# Patient Record
Sex: Female | Born: 1945 | Race: White | Hispanic: No | Marital: Married
Health system: Southern US, Community
[De-identification: ages and names within clinical notes are randomized; demographics above are authoritative.]

---

## 2000-03-13 ENCOUNTER — Encounter: Payer: Self-pay | Admitting: *Deleted

## 2000-03-13 ENCOUNTER — Encounter: Admission: RE | Admit: 2000-03-13 | Discharge: 2000-03-13 | Payer: Self-pay | Admitting: *Deleted

## 2000-04-08 ENCOUNTER — Ambulatory Visit (HOSPITAL_COMMUNITY): Admission: RE | Admit: 2000-04-08 | Discharge: 2000-04-08 | Payer: Self-pay | Admitting: *Deleted

## 2000-04-08 ENCOUNTER — Encounter: Payer: Self-pay | Admitting: *Deleted

## 2000-06-10 ENCOUNTER — Observation Stay (HOSPITAL_COMMUNITY): Admission: EM | Admit: 2000-06-10 | Discharge: 2000-06-12 | Payer: Self-pay | Admitting: *Deleted

## 2005-11-01 ENCOUNTER — Other Ambulatory Visit: Admission: RE | Admit: 2005-11-01 | Discharge: 2005-11-01 | Payer: Self-pay | Admitting: *Deleted

## 2012-09-11 DIAGNOSIS — J309 Allergic rhinitis, unspecified: Secondary | ICD-10-CM | POA: Diagnosis not present

## 2013-03-09 DIAGNOSIS — J309 Allergic rhinitis, unspecified: Secondary | ICD-10-CM | POA: Diagnosis not present

## 2014-01-20 DIAGNOSIS — F329 Major depressive disorder, single episode, unspecified: Secondary | ICD-10-CM | POA: Diagnosis not present

## 2014-01-20 DIAGNOSIS — J309 Allergic rhinitis, unspecified: Secondary | ICD-10-CM | POA: Diagnosis not present

## 2014-01-20 DIAGNOSIS — Z1211 Encounter for screening for malignant neoplasm of colon: Secondary | ICD-10-CM | POA: Diagnosis not present

## 2014-01-25 DIAGNOSIS — Z1211 Encounter for screening for malignant neoplasm of colon: Secondary | ICD-10-CM | POA: Diagnosis not present

## 2014-03-10 DIAGNOSIS — J069 Acute upper respiratory infection, unspecified: Secondary | ICD-10-CM | POA: Diagnosis not present

## 2014-03-10 DIAGNOSIS — E663 Overweight: Secondary | ICD-10-CM | POA: Diagnosis not present

## 2014-03-10 DIAGNOSIS — J309 Allergic rhinitis, unspecified: Secondary | ICD-10-CM | POA: Diagnosis not present

## 2014-03-10 DIAGNOSIS — Z780 Asymptomatic menopausal state: Secondary | ICD-10-CM | POA: Diagnosis not present

## 2014-03-10 DIAGNOSIS — Z0001 Encounter for general adult medical examination with abnormal findings: Secondary | ICD-10-CM | POA: Diagnosis not present

## 2014-03-10 DIAGNOSIS — Z23 Encounter for immunization: Secondary | ICD-10-CM | POA: Diagnosis not present

## 2014-03-10 DIAGNOSIS — F329 Major depressive disorder, single episode, unspecified: Secondary | ICD-10-CM | POA: Diagnosis not present

## 2014-03-10 DIAGNOSIS — E782 Mixed hyperlipidemia: Secondary | ICD-10-CM | POA: Diagnosis not present

## 2014-03-22 DIAGNOSIS — R0981 Nasal congestion: Secondary | ICD-10-CM | POA: Diagnosis not present

## 2014-03-22 DIAGNOSIS — R5383 Other fatigue: Secondary | ICD-10-CM | POA: Diagnosis not present

## 2014-03-25 DIAGNOSIS — M818 Other osteoporosis without current pathological fracture: Secondary | ICD-10-CM | POA: Diagnosis not present

## 2014-04-07 DIAGNOSIS — K219 Gastro-esophageal reflux disease without esophagitis: Secondary | ICD-10-CM | POA: Diagnosis not present

## 2014-04-07 DIAGNOSIS — R05 Cough: Secondary | ICD-10-CM | POA: Diagnosis not present

## 2014-04-07 DIAGNOSIS — J309 Allergic rhinitis, unspecified: Secondary | ICD-10-CM | POA: Diagnosis not present

## 2014-04-07 DIAGNOSIS — J300 Vasomotor rhinitis: Secondary | ICD-10-CM | POA: Diagnosis not present

## 2014-04-21 DIAGNOSIS — R7309 Other abnormal glucose: Secondary | ICD-10-CM | POA: Diagnosis not present

## 2014-04-21 DIAGNOSIS — Z0001 Encounter for general adult medical examination with abnormal findings: Secondary | ICD-10-CM | POA: Diagnosis not present

## 2014-04-21 DIAGNOSIS — M810 Age-related osteoporosis without current pathological fracture: Secondary | ICD-10-CM | POA: Diagnosis not present

## 2014-04-21 DIAGNOSIS — E785 Hyperlipidemia, unspecified: Secondary | ICD-10-CM | POA: Diagnosis not present

## 2014-09-29 DIAGNOSIS — M79675 Pain in left toe(s): Secondary | ICD-10-CM | POA: Diagnosis not present

## 2014-09-29 DIAGNOSIS — B351 Tinea unguium: Secondary | ICD-10-CM | POA: Diagnosis not present

## 2014-09-29 DIAGNOSIS — L600 Ingrowing nail: Secondary | ICD-10-CM | POA: Diagnosis not present

## 2014-10-25 DIAGNOSIS — F329 Major depressive disorder, single episode, unspecified: Secondary | ICD-10-CM | POA: Diagnosis not present

## 2014-10-25 DIAGNOSIS — E663 Overweight: Secondary | ICD-10-CM | POA: Diagnosis not present

## 2014-10-25 DIAGNOSIS — M810 Age-related osteoporosis without current pathological fracture: Secondary | ICD-10-CM | POA: Diagnosis not present

## 2014-10-25 DIAGNOSIS — R7309 Other abnormal glucose: Secondary | ICD-10-CM | POA: Diagnosis not present

## 2014-10-25 DIAGNOSIS — E785 Hyperlipidemia, unspecified: Secondary | ICD-10-CM | POA: Diagnosis not present

## 2015-04-20 DIAGNOSIS — Z23 Encounter for immunization: Secondary | ICD-10-CM | POA: Diagnosis not present

## 2015-05-05 DIAGNOSIS — F329 Major depressive disorder, single episode, unspecified: Secondary | ICD-10-CM | POA: Diagnosis not present

## 2015-05-05 DIAGNOSIS — M25649 Stiffness of unspecified hand, not elsewhere classified: Secondary | ICD-10-CM | POA: Diagnosis not present

## 2015-05-05 DIAGNOSIS — M25561 Pain in right knee: Secondary | ICD-10-CM | POA: Diagnosis not present

## 2016-02-09 ENCOUNTER — Other Ambulatory Visit: Payer: Self-pay

## 2016-05-08 DIAGNOSIS — R739 Hyperglycemia, unspecified: Secondary | ICD-10-CM | POA: Diagnosis not present

## 2016-05-08 DIAGNOSIS — K219 Gastro-esophageal reflux disease without esophagitis: Secondary | ICD-10-CM | POA: Diagnosis not present

## 2016-05-08 DIAGNOSIS — F329 Major depressive disorder, single episode, unspecified: Secondary | ICD-10-CM | POA: Diagnosis not present

## 2016-05-08 DIAGNOSIS — Z23 Encounter for immunization: Secondary | ICD-10-CM | POA: Diagnosis not present

## 2016-05-08 DIAGNOSIS — M810 Age-related osteoporosis without current pathological fracture: Secondary | ICD-10-CM | POA: Diagnosis not present

## 2016-05-08 DIAGNOSIS — J309 Allergic rhinitis, unspecified: Secondary | ICD-10-CM | POA: Diagnosis not present

## 2016-05-08 DIAGNOSIS — E7800 Pure hypercholesterolemia, unspecified: Secondary | ICD-10-CM | POA: Diagnosis not present

## 2016-05-08 DIAGNOSIS — Z1211 Encounter for screening for malignant neoplasm of colon: Secondary | ICD-10-CM | POA: Diagnosis not present

## 2016-05-08 DIAGNOSIS — Z0000 Encounter for general adult medical examination without abnormal findings: Secondary | ICD-10-CM | POA: Diagnosis not present

## 2016-05-23 DIAGNOSIS — Z1211 Encounter for screening for malignant neoplasm of colon: Secondary | ICD-10-CM | POA: Diagnosis not present

## 2016-06-15 DIAGNOSIS — J329 Chronic sinusitis, unspecified: Secondary | ICD-10-CM | POA: Diagnosis not present

## 2016-06-26 DIAGNOSIS — M8588 Other specified disorders of bone density and structure, other site: Secondary | ICD-10-CM | POA: Diagnosis not present

## 2016-06-26 DIAGNOSIS — M810 Age-related osteoporosis without current pathological fracture: Secondary | ICD-10-CM | POA: Diagnosis not present

## 2017-04-24 DIAGNOSIS — Z23 Encounter for immunization: Secondary | ICD-10-CM | POA: Diagnosis not present

## 2017-09-17 DIAGNOSIS — E119 Type 2 diabetes mellitus without complications: Secondary | ICD-10-CM | POA: Diagnosis not present

## 2017-09-17 DIAGNOSIS — Z6828 Body mass index (BMI) 28.0-28.9, adult: Secondary | ICD-10-CM | POA: Diagnosis not present

## 2017-09-17 DIAGNOSIS — Z0000 Encounter for general adult medical examination without abnormal findings: Secondary | ICD-10-CM | POA: Diagnosis not present

## 2017-09-17 DIAGNOSIS — M8580 Other specified disorders of bone density and structure, unspecified site: Secondary | ICD-10-CM | POA: Diagnosis not present

## 2017-09-17 DIAGNOSIS — E663 Overweight: Secondary | ICD-10-CM | POA: Diagnosis not present

## 2017-09-17 DIAGNOSIS — Z1389 Encounter for screening for other disorder: Secondary | ICD-10-CM | POA: Diagnosis not present

## 2017-09-17 DIAGNOSIS — F325 Major depressive disorder, single episode, in full remission: Secondary | ICD-10-CM | POA: Diagnosis not present

## 2017-12-09 ENCOUNTER — Other Ambulatory Visit: Payer: Self-pay | Admitting: Internal Medicine

## 2018-01-02 DIAGNOSIS — E782 Mixed hyperlipidemia: Secondary | ICD-10-CM | POA: Diagnosis not present

## 2018-01-20 DIAGNOSIS — H25812 Combined forms of age-related cataract, left eye: Secondary | ICD-10-CM | POA: Diagnosis not present

## 2018-01-20 DIAGNOSIS — Z01818 Encounter for other preprocedural examination: Secondary | ICD-10-CM | POA: Diagnosis not present

## 2018-02-14 DIAGNOSIS — H2512 Age-related nuclear cataract, left eye: Secondary | ICD-10-CM | POA: Diagnosis not present

## 2018-02-14 DIAGNOSIS — H25812 Combined forms of age-related cataract, left eye: Secondary | ICD-10-CM | POA: Diagnosis not present

## 2018-02-28 DIAGNOSIS — H2511 Age-related nuclear cataract, right eye: Secondary | ICD-10-CM | POA: Diagnosis not present

## 2018-02-28 DIAGNOSIS — H25811 Combined forms of age-related cataract, right eye: Secondary | ICD-10-CM | POA: Diagnosis not present

## 2018-03-24 DIAGNOSIS — Z23 Encounter for immunization: Secondary | ICD-10-CM | POA: Diagnosis not present

## 2018-03-24 DIAGNOSIS — M8580 Other specified disorders of bone density and structure, unspecified site: Secondary | ICD-10-CM | POA: Diagnosis not present

## 2018-03-24 DIAGNOSIS — E663 Overweight: Secondary | ICD-10-CM | POA: Diagnosis not present

## 2018-03-24 DIAGNOSIS — Z6828 Body mass index (BMI) 28.0-28.9, adult: Secondary | ICD-10-CM | POA: Diagnosis not present

## 2018-03-24 DIAGNOSIS — E1169 Type 2 diabetes mellitus with other specified complication: Secondary | ICD-10-CM | POA: Diagnosis not present

## 2018-03-24 DIAGNOSIS — F325 Major depressive disorder, single episode, in full remission: Secondary | ICD-10-CM | POA: Diagnosis not present

## 2018-03-24 DIAGNOSIS — E7800 Pure hypercholesterolemia, unspecified: Secondary | ICD-10-CM | POA: Diagnosis not present

## 2018-10-15 DIAGNOSIS — M8580 Other specified disorders of bone density and structure, unspecified site: Secondary | ICD-10-CM | POA: Diagnosis not present

## 2018-10-15 DIAGNOSIS — F325 Major depressive disorder, single episode, in full remission: Secondary | ICD-10-CM | POA: Diagnosis not present

## 2018-10-15 DIAGNOSIS — Z0000 Encounter for general adult medical examination without abnormal findings: Secondary | ICD-10-CM | POA: Diagnosis not present

## 2018-10-15 DIAGNOSIS — E782 Mixed hyperlipidemia: Secondary | ICD-10-CM | POA: Diagnosis not present

## 2018-10-15 DIAGNOSIS — E1169 Type 2 diabetes mellitus with other specified complication: Secondary | ICD-10-CM | POA: Diagnosis not present

## 2019-04-03 DIAGNOSIS — Z23 Encounter for immunization: Secondary | ICD-10-CM | POA: Diagnosis not present

## 2019-04-08 DIAGNOSIS — E782 Mixed hyperlipidemia: Secondary | ICD-10-CM | POA: Diagnosis not present

## 2019-04-08 DIAGNOSIS — M8580 Other specified disorders of bone density and structure, unspecified site: Secondary | ICD-10-CM | POA: Diagnosis not present

## 2019-04-08 DIAGNOSIS — F325 Major depressive disorder, single episode, in full remission: Secondary | ICD-10-CM | POA: Diagnosis not present

## 2019-04-08 DIAGNOSIS — E1169 Type 2 diabetes mellitus with other specified complication: Secondary | ICD-10-CM | POA: Diagnosis not present

## 2019-04-15 DIAGNOSIS — E782 Mixed hyperlipidemia: Secondary | ICD-10-CM | POA: Diagnosis not present

## 2019-04-15 DIAGNOSIS — M859 Disorder of bone density and structure, unspecified: Secondary | ICD-10-CM | POA: Diagnosis not present

## 2019-04-15 DIAGNOSIS — E1169 Type 2 diabetes mellitus with other specified complication: Secondary | ICD-10-CM | POA: Diagnosis not present

## 2019-04-15 DIAGNOSIS — F325 Major depressive disorder, single episode, in full remission: Secondary | ICD-10-CM | POA: Diagnosis not present

## 2019-07-30 ENCOUNTER — Ambulatory Visit: Payer: Medicare Other | Attending: Internal Medicine

## 2019-07-30 ENCOUNTER — Ambulatory Visit: Payer: Self-pay

## 2019-07-30 DIAGNOSIS — Z23 Encounter for immunization: Secondary | ICD-10-CM | POA: Insufficient documentation

## 2019-07-30 NOTE — Progress Notes (Signed)
Covid-19 Vaccination [facility]  Name:  Fatisha Petitte    MRN: [id number] DOB: 1945-11-14  07/30/2019  Ms. Ferdon was observed post Covid-19 immunization for 15 minutes without incidence. She was provided with Vaccine Information Sheet and instruction to access the V-Safe system.   Ms. Sicking was instructed to call 911 with any severe reactions post vaccine: Marland Kitchen Difficulty breathing  . Swelling of your face and throat  . A fast heartbeat  . A bad rash all over your body  . Dizziness and weakness    Immunizations Administered    Name Date Dose VIS Date Route   Pfizer COVID-19 Vaccine 07/30/2019  2:15 PM 0.3 mL 05/15/2019 Intramuscular   Manufacturer: [employer], [employer]   Lot: [id number]   NDC: [address]-7

## 2019-08-25 ENCOUNTER — Ambulatory Visit: Payer: Medicare Other | Attending: Internal Medicine

## 2019-08-25 DIAGNOSIS — Z23 Encounter for immunization: Secondary | ICD-10-CM

## 2019-08-25 NOTE — Progress Notes (Signed)
Covid-19 Vaccination Clinic  Name:  Emily Pruitt    MRN: [id number] DOB: 1946-01-05  08/25/2019  Ms. Calcutt was observed post Covid-19 immunization for 15 minutes without incident. She was provided with Vaccine Information Sheet and instruction to access the V-Safe system.   Ms. Gilford was instructed to call 911 with any severe reactions post vaccine: Marland Kitchen Difficulty breathing  . Swelling of face and throat  . A fast heartbeat  . A bad rash all over body  . Dizziness and weakness   Immunizations Administered    Name Date Dose VIS Date Route   Pfizer COVID-19 Vaccine 08/25/2019  9:09 AM 0.3 mL 05/15/2019 Intramuscular   Manufacturer: [employer], [employer]   Lot: [id number]   NDC: [address]-9

## 2019-10-21 DIAGNOSIS — F325 Major depressive disorder, single episode, in full remission: Secondary | ICD-10-CM | POA: Diagnosis not present

## 2019-10-21 DIAGNOSIS — E663 Overweight: Secondary | ICD-10-CM | POA: Diagnosis not present

## 2019-10-21 DIAGNOSIS — Z6828 Body mass index (BMI) 28.0-28.9, adult: Secondary | ICD-10-CM | POA: Diagnosis not present

## 2019-10-21 DIAGNOSIS — M8580 Other specified disorders of bone density and structure, unspecified site: Secondary | ICD-10-CM | POA: Diagnosis not present

## 2019-10-21 DIAGNOSIS — E1169 Type 2 diabetes mellitus with other specified complication: Secondary | ICD-10-CM | POA: Diagnosis not present

## 2019-10-21 DIAGNOSIS — Z0000 Encounter for general adult medical examination without abnormal findings: Secondary | ICD-10-CM | POA: Diagnosis not present

## 2019-10-21 DIAGNOSIS — Z1159 Encounter for screening for other viral diseases: Secondary | ICD-10-CM | POA: Diagnosis not present

## 2019-10-21 DIAGNOSIS — E782 Mixed hyperlipidemia: Secondary | ICD-10-CM | POA: Diagnosis not present

## 2019-10-26 ENCOUNTER — Other Ambulatory Visit: Payer: Self-pay | Admitting: Family Medicine

## 2019-10-26 DIAGNOSIS — M8580 Other specified disorders of bone density and structure, unspecified site: Secondary | ICD-10-CM

## 2019-10-27 DIAGNOSIS — H04123 Dry eye syndrome of bilateral lacrimal glands: Secondary | ICD-10-CM | POA: Diagnosis not present

## 2019-10-27 DIAGNOSIS — E119 Type 2 diabetes mellitus without complications: Secondary | ICD-10-CM | POA: Diagnosis not present

## 2020-02-19 ENCOUNTER — Ambulatory Visit
Admission: RE | Admit: 2020-02-19 | Discharge: 2020-02-19 | Disposition: A | Discharge status: Home / Self Care | Payer: Medicare Other | Source: Ambulatory Visit | Attending: Family Medicine | Admitting: Family Medicine

## 2020-02-19 ENCOUNTER — Other Ambulatory Visit: Payer: Self-pay

## 2020-02-19 DIAGNOSIS — M8580 Other specified disorders of bone density and structure, unspecified site: Secondary | ICD-10-CM

## 2020-02-19 DIAGNOSIS — Z780 Asymptomatic menopausal state: Secondary | ICD-10-CM | POA: Diagnosis not present

## 2020-02-19 DIAGNOSIS — M85851 Other specified disorders of bone density and structure, right thigh: Secondary | ICD-10-CM | POA: Diagnosis not present

## 2020-03-23 DIAGNOSIS — Z23 Encounter for immunization: Secondary | ICD-10-CM | POA: Diagnosis not present

## 2020-04-22 DIAGNOSIS — E1169 Type 2 diabetes mellitus with other specified complication: Secondary | ICD-10-CM | POA: Diagnosis not present

## 2020-04-22 DIAGNOSIS — Z6826 Body mass index (BMI) 26.0-26.9, adult: Secondary | ICD-10-CM | POA: Diagnosis not present

## 2020-04-22 DIAGNOSIS — E663 Overweight: Secondary | ICD-10-CM | POA: Diagnosis not present

## 2020-04-22 DIAGNOSIS — F419 Anxiety disorder, unspecified: Secondary | ICD-10-CM | POA: Diagnosis not present

## 2020-04-22 DIAGNOSIS — E782 Mixed hyperlipidemia: Secondary | ICD-10-CM | POA: Diagnosis not present

## 2020-04-22 DIAGNOSIS — F325 Major depressive disorder, single episode, in full remission: Secondary | ICD-10-CM | POA: Diagnosis not present

## 2020-07-17 DIAGNOSIS — Z20822 Contact with and (suspected) exposure to covid-19: Secondary | ICD-10-CM | POA: Diagnosis not present

## 2020-10-24 DIAGNOSIS — Z23 Encounter for immunization: Secondary | ICD-10-CM | POA: Diagnosis not present

## 2020-11-23 DIAGNOSIS — E782 Mixed hyperlipidemia: Secondary | ICD-10-CM | POA: Diagnosis not present

## 2020-11-23 DIAGNOSIS — E663 Overweight: Secondary | ICD-10-CM | POA: Diagnosis not present

## 2020-11-23 DIAGNOSIS — M8580 Other specified disorders of bone density and structure, unspecified site: Secondary | ICD-10-CM | POA: Diagnosis not present

## 2020-11-23 DIAGNOSIS — Z6828 Body mass index (BMI) 28.0-28.9, adult: Secondary | ICD-10-CM | POA: Diagnosis not present

## 2020-11-23 DIAGNOSIS — Z0000 Encounter for general adult medical examination without abnormal findings: Secondary | ICD-10-CM | POA: Diagnosis not present

## 2020-11-23 DIAGNOSIS — F325 Major depressive disorder, single episode, in full remission: Secondary | ICD-10-CM | POA: Diagnosis not present

## 2020-11-23 DIAGNOSIS — E1169 Type 2 diabetes mellitus with other specified complication: Secondary | ICD-10-CM | POA: Diagnosis not present

## 2020-12-03 DIAGNOSIS — U071 COVID-19: Secondary | ICD-10-CM | POA: Diagnosis not present

## 2021-03-07 DIAGNOSIS — Z23 Encounter for immunization: Secondary | ICD-10-CM | POA: Diagnosis not present

## 2022-02-02 DIAGNOSIS — Z6828 Body mass index (BMI) 28.0-28.9, adult: Secondary | ICD-10-CM | POA: Diagnosis not present

## 2022-02-02 DIAGNOSIS — F325 Major depressive disorder, single episode, in full remission: Secondary | ICD-10-CM | POA: Diagnosis not present

## 2022-02-02 DIAGNOSIS — E663 Overweight: Secondary | ICD-10-CM | POA: Diagnosis not present

## 2022-02-02 DIAGNOSIS — Z0000 Encounter for general adult medical examination without abnormal findings: Secondary | ICD-10-CM | POA: Diagnosis not present

## 2022-02-02 DIAGNOSIS — E1169 Type 2 diabetes mellitus with other specified complication: Secondary | ICD-10-CM | POA: Diagnosis not present

## 2022-02-02 DIAGNOSIS — M8580 Other specified disorders of bone density and structure, unspecified site: Secondary | ICD-10-CM | POA: Diagnosis not present

## 2022-02-02 DIAGNOSIS — E782 Mixed hyperlipidemia: Secondary | ICD-10-CM | POA: Diagnosis not present

## 2022-02-15 DIAGNOSIS — E119 Type 2 diabetes mellitus without complications: Secondary | ICD-10-CM | POA: Diagnosis not present

## 2022-02-15 DIAGNOSIS — H40013 Open angle with borderline findings, low risk, bilateral: Secondary | ICD-10-CM | POA: Diagnosis not present

## 2022-02-19 DIAGNOSIS — Z23 Encounter for immunization: Secondary | ICD-10-CM | POA: Diagnosis not present

## 2022-08-07 DIAGNOSIS — E1169 Type 2 diabetes mellitus with other specified complication: Secondary | ICD-10-CM | POA: Diagnosis not present

## 2022-12-24 DIAGNOSIS — U071 COVID-19: Secondary | ICD-10-CM | POA: Diagnosis not present

## 2022-12-24 DIAGNOSIS — Z6827 Body mass index (BMI) 27.0-27.9, adult: Secondary | ICD-10-CM | POA: Diagnosis not present

## 2023-02-06 DIAGNOSIS — E663 Overweight: Secondary | ICD-10-CM | POA: Diagnosis not present

## 2023-02-06 DIAGNOSIS — Z1389 Encounter for screening for other disorder: Secondary | ICD-10-CM | POA: Diagnosis not present

## 2023-02-06 DIAGNOSIS — Z0000 Encounter for general adult medical examination without abnormal findings: Secondary | ICD-10-CM | POA: Diagnosis not present

## 2023-02-13 DIAGNOSIS — E663 Overweight: Secondary | ICD-10-CM | POA: Diagnosis not present

## 2023-02-13 DIAGNOSIS — E782 Mixed hyperlipidemia: Secondary | ICD-10-CM | POA: Diagnosis not present

## 2023-02-13 DIAGNOSIS — E1122 Type 2 diabetes mellitus with diabetic chronic kidney disease: Secondary | ICD-10-CM | POA: Diagnosis not present

## 2023-02-13 DIAGNOSIS — F325 Major depressive disorder, single episode, in full remission: Secondary | ICD-10-CM | POA: Diagnosis not present

## 2023-02-13 DIAGNOSIS — Z6827 Body mass index (BMI) 27.0-27.9, adult: Secondary | ICD-10-CM | POA: Diagnosis not present

## 2023-02-13 DIAGNOSIS — F410 Panic disorder [episodic paroxysmal anxiety] without agoraphobia: Secondary | ICD-10-CM | POA: Diagnosis not present

## 2023-02-13 DIAGNOSIS — N1831 Chronic kidney disease, stage 3a: Secondary | ICD-10-CM | POA: Diagnosis not present

## 2023-04-02 DIAGNOSIS — Z23 Encounter for immunization: Secondary | ICD-10-CM | POA: Diagnosis not present

## 2023-04-04 DIAGNOSIS — F411 Generalized anxiety disorder: Secondary | ICD-10-CM | POA: Diagnosis not present

## 2023-05-04 DIAGNOSIS — F411 Generalized anxiety disorder: Secondary | ICD-10-CM | POA: Diagnosis not present

## 2023-08-13 DIAGNOSIS — J069 Acute upper respiratory infection, unspecified: Secondary | ICD-10-CM | POA: Diagnosis not present

## 2023-08-13 DIAGNOSIS — E663 Overweight: Secondary | ICD-10-CM | POA: Diagnosis not present

## 2023-08-13 DIAGNOSIS — E782 Mixed hyperlipidemia: Secondary | ICD-10-CM | POA: Diagnosis not present

## 2023-08-13 DIAGNOSIS — F325 Major depressive disorder, single episode, in full remission: Secondary | ICD-10-CM | POA: Diagnosis not present

## 2023-08-13 DIAGNOSIS — E1122 Type 2 diabetes mellitus with diabetic chronic kidney disease: Secondary | ICD-10-CM | POA: Diagnosis not present

## 2023-08-13 DIAGNOSIS — M8580 Other specified disorders of bone density and structure, unspecified site: Secondary | ICD-10-CM | POA: Diagnosis not present

## 2023-08-13 DIAGNOSIS — N1831 Chronic kidney disease, stage 3a: Secondary | ICD-10-CM | POA: Diagnosis not present

## 2023-11-20 DIAGNOSIS — E7800 Pure hypercholesterolemia, unspecified: Secondary | ICD-10-CM | POA: Diagnosis not present

## 2024-01-21 DIAGNOSIS — H04123 Dry eye syndrome of bilateral lacrimal glands: Secondary | ICD-10-CM | POA: Diagnosis not present

## 2024-01-21 DIAGNOSIS — E119 Type 2 diabetes mellitus without complications: Secondary | ICD-10-CM | POA: Diagnosis not present

## 2024-01-21 DIAGNOSIS — H40013 Open angle with borderline findings, low risk, bilateral: Secondary | ICD-10-CM | POA: Diagnosis not present

## 2024-02-10 DIAGNOSIS — Z0000 Encounter for general adult medical examination without abnormal findings: Secondary | ICD-10-CM | POA: Diagnosis not present

## 2024-02-28 DIAGNOSIS — Z23 Encounter for immunization: Secondary | ICD-10-CM | POA: Diagnosis not present

## 2024-04-15 DIAGNOSIS — F325 Major depressive disorder, single episode, in full remission: Secondary | ICD-10-CM | POA: Diagnosis not present

## 2024-04-15 DIAGNOSIS — N1831 Chronic kidney disease, stage 3a: Secondary | ICD-10-CM | POA: Diagnosis not present

## 2024-04-15 DIAGNOSIS — E663 Overweight: Secondary | ICD-10-CM | POA: Diagnosis not present

## 2024-04-15 DIAGNOSIS — E1122 Type 2 diabetes mellitus with diabetic chronic kidney disease: Secondary | ICD-10-CM | POA: Diagnosis not present

## 2024-04-15 DIAGNOSIS — Z6828 Body mass index (BMI) 28.0-28.9, adult: Secondary | ICD-10-CM | POA: Diagnosis not present

## 2024-04-15 DIAGNOSIS — E782 Mixed hyperlipidemia: Secondary | ICD-10-CM | POA: Diagnosis not present
